# Patient Record
Sex: Male | Born: 1975
Health system: Southern US, Community
[De-identification: ages and names within clinical notes are randomized; demographics above are authoritative.]

## PROBLEM LIST (undated history)

## (undated) DIAGNOSIS — E119 Type 2 diabetes mellitus without complications: Secondary | ICD-10-CM

## (undated) DIAGNOSIS — J45909 Unspecified asthma, uncomplicated: Secondary | ICD-10-CM

## (undated) DIAGNOSIS — I10 Essential (primary) hypertension: Secondary | ICD-10-CM

## (undated) DIAGNOSIS — J302 Other seasonal allergic rhinitis: Secondary | ICD-10-CM

## (undated) DIAGNOSIS — E663 Overweight: Secondary | ICD-10-CM

## (undated) HISTORY — DX: Other seasonal allergic rhinitis: J30.2

## (undated) HISTORY — DX: Overweight: E66.3

---

## 1998-06-24 ENCOUNTER — Emergency Department (HOSPITAL_COMMUNITY): Admission: EM | Admit: 1998-06-24 | Discharge: 1998-06-24 | Payer: Self-pay | Admitting: Emergency Medicine

## 1998-06-25 ENCOUNTER — Encounter: Payer: Self-pay | Admitting: Emergency Medicine

## 1999-11-09 ENCOUNTER — Emergency Department (HOSPITAL_COMMUNITY): Admission: EM | Admit: 1999-11-09 | Discharge: 1999-11-09 | Payer: Self-pay | Admitting: Emergency Medicine

## 2000-05-20 ENCOUNTER — Emergency Department (HOSPITAL_COMMUNITY): Admission: EM | Admit: 2000-05-20 | Discharge: 2000-05-20 | Payer: Self-pay | Admitting: *Deleted

## 2000-10-01 ENCOUNTER — Emergency Department (HOSPITAL_COMMUNITY): Admission: EM | Admit: 2000-10-01 | Discharge: 2000-10-01 | Payer: Self-pay | Admitting: Emergency Medicine

## 2003-05-28 ENCOUNTER — Emergency Department (HOSPITAL_COMMUNITY): Admission: AD | Admit: 2003-05-28 | Discharge: 2003-05-28 | Payer: Self-pay | Admitting: Family Medicine

## 2007-09-19 ENCOUNTER — Emergency Department (HOSPITAL_COMMUNITY): Admission: EM | Admit: 2007-09-19 | Discharge: 2007-09-19 | Payer: Self-pay | Admitting: Family Medicine

## 2009-08-29 ENCOUNTER — Emergency Department (HOSPITAL_COMMUNITY): Admission: EM | Admit: 2009-08-29 | Discharge: 2009-08-29 | Payer: Self-pay | Admitting: Emergency Medicine

## 2009-12-09 ENCOUNTER — Emergency Department (HOSPITAL_BASED_OUTPATIENT_CLINIC_OR_DEPARTMENT_OTHER): Admission: EM | Admit: 2009-12-09 | Discharge: 2009-12-10 | Payer: Self-pay | Admitting: Emergency Medicine

## 2009-12-14 ENCOUNTER — Emergency Department (HOSPITAL_BASED_OUTPATIENT_CLINIC_OR_DEPARTMENT_OTHER): Admission: EM | Admit: 2009-12-14 | Discharge: 2009-12-14 | Payer: Self-pay | Admitting: Emergency Medicine

## 2010-06-30 ENCOUNTER — Emergency Department (HOSPITAL_COMMUNITY): Admission: EM | Admit: 2010-06-30 | Discharge: 2010-06-30 | Payer: Self-pay | Admitting: Family Medicine

## 2011-04-26 LAB — INFLUENZA A AND B ANTIGEN (CONVERTED LAB)
Inflenza A Ag: POSITIVE — AB
Influenza B Ag: NEGATIVE

## 2013-02-15 ENCOUNTER — Emergency Department (HOSPITAL_BASED_OUTPATIENT_CLINIC_OR_DEPARTMENT_OTHER)
Admission: EM | Admit: 2013-02-15 | Discharge: 2013-02-15 | Disposition: A | Discharge status: Home / Self Care | Payer: BC Managed Care – PPO | Attending: Emergency Medicine | Admitting: Emergency Medicine

## 2013-02-15 ENCOUNTER — Encounter (HOSPITAL_BASED_OUTPATIENT_CLINIC_OR_DEPARTMENT_OTHER): Payer: Self-pay | Admitting: Emergency Medicine

## 2013-02-15 DIAGNOSIS — R51 Headache: Secondary | ICD-10-CM | POA: Insufficient documentation

## 2013-02-15 DIAGNOSIS — J020 Streptococcal pharyngitis: Secondary | ICD-10-CM

## 2013-02-15 DIAGNOSIS — J45909 Unspecified asthma, uncomplicated: Secondary | ICD-10-CM | POA: Insufficient documentation

## 2013-02-15 HISTORY — DX: Unspecified asthma, uncomplicated: J45.909

## 2013-02-15 LAB — RAPID STREP SCREEN (MED CTR MEBANE ONLY): Streptococcus, Group A Screen (Direct): POSITIVE — AB

## 2013-02-15 MED ORDER — PENICILLIN G BENZATHINE 1200000 UNIT/2ML IM SUSP
2.4000 10*6.[IU] | Freq: Once | INTRAMUSCULAR | Status: AC
  Administered 2013-02-15: 2.4 10*6.[IU] via INTRAMUSCULAR
  Filled 2013-02-15: qty 4

## 2013-02-15 MED ORDER — IBUPROFEN 800 MG PO TABS
800.0000 mg | ORAL_TABLET | Freq: Once | ORAL | Status: AC
  Administered 2013-02-15: 800 mg via ORAL
  Filled 2013-02-15: qty 1

## 2013-02-15 NOTE — ED Provider Notes (Signed)
Medical screening examination/treatment/procedure(s) were performed by non-physician practitioner and as supervising physician I was immediately available for consultation/collaboration.   Mikell Camp, MD 02/15/13 2230 

## 2013-02-15 NOTE — ED Provider Notes (Signed)
   History    CSN: [id number] Arrival date & time 02/15/13  1904  First MD Initiated Contact with Patient 02/15/13 1913     Chief Complaint  Patient presents with  . Headache  . Sore Throat   (Consider location/radiation/quality/duration/timing/severity/associated sxs/prior Treatment) Patient is a 37 y.o. male presenting with headaches and pharyngitis. The history is provided by the patient. No language interpreter was used.  Headache Pain location:  Generalized Radiates to:  Does not radiate Onset quality:  Gradual Duration:  2 days Timing:  Constant Progression:  Worsening Chronicity:  New Context: not activity   Relieved by:  Nothing Worsened by:  Nothing tried Sore Throat Associated symptoms include headaches.  Pt complains of a sorethroat and swelling in glands Past Medical History  Diagnosis Date  . Asthma    History reviewed. No pertinent past surgical history. No family history on file. History  Substance Use Topics  . Smoking status: Never Smoker   . Smokeless tobacco: Not on file  . Alcohol Use: No    Review of Systems  Neurological: Positive for headaches.  All other systems reviewed and are negative.    Allergies  Review of patient's allergies indicates no known allergies.  Home Medications   Current Outpatient Rx  Name  Route  Sig  Dispense  Refill  . OVER THE COUNTER MEDICATION                BP 148/77  Pulse 99  Temp(Src) 99.2 F (37.3 C)  Resp 18  Ht 6\' 1"  (1.854 m)  Wt 320 lb (145.151 kg)  BMI 42.23 kg/m2  SpO2 98% Physical Exam  Nursing note and vitals reviewed. Constitutional: He appears well-developed and well-nourished.  HENT:  Head: Normocephalic.  Right Ear: External ear normal.  Mouth/Throat: Oropharyngeal exudate present.  Erythema throat,    Eyes: Conjunctivae are normal. Pupils are equal, round, and reactive to light.  Neck: Normal range of motion.  Cardiovascular: Normal rate and normal heart sounds.    Pulmonary/Chest: Effort normal.  Musculoskeletal: Normal range of motion.  Skin: Skin is warm.    ED Course  Procedures (including critical care time) Labs Reviewed  RAPID STREP SCREEN - Abnormal; Notable for the following:    Streptococcus, Group A Screen (Direct) POSITIVE (*)    All other components within normal limits   No results found. 1. Strep pharyngitis     MDM  Strep positive  Pt given injection of bicillian.   Ibuprofen or tylenol for fever  Elson Areas, PA-C 02/15/13 2033

## 2013-02-15 NOTE — ED Notes (Signed)
Pt c/o headache, sore throat, fever and body aches since this am.

## 2014-02-10 ENCOUNTER — Encounter (HOSPITAL_BASED_OUTPATIENT_CLINIC_OR_DEPARTMENT_OTHER): Payer: Self-pay | Admitting: Emergency Medicine

## 2014-02-10 ENCOUNTER — Emergency Department (HOSPITAL_BASED_OUTPATIENT_CLINIC_OR_DEPARTMENT_OTHER)
Admission: EM | Admit: 2014-02-10 | Discharge: 2014-02-10 | Disposition: A | Discharge status: Home / Self Care | Payer: Worker's Compensation | Attending: Emergency Medicine | Admitting: Emergency Medicine

## 2014-02-10 ENCOUNTER — Emergency Department (HOSPITAL_BASED_OUTPATIENT_CLINIC_OR_DEPARTMENT_OTHER): Payer: Worker's Compensation

## 2014-02-10 DIAGNOSIS — J45909 Unspecified asthma, uncomplicated: Secondary | ICD-10-CM | POA: Insufficient documentation

## 2014-02-10 DIAGNOSIS — S5000XA Contusion of unspecified elbow, initial encounter: Secondary | ICD-10-CM | POA: Diagnosis not present

## 2014-02-10 DIAGNOSIS — Y9389 Activity, other specified: Secondary | ICD-10-CM | POA: Insufficient documentation

## 2014-02-10 DIAGNOSIS — Y9289 Other specified places as the place of occurrence of the external cause: Secondary | ICD-10-CM | POA: Insufficient documentation

## 2014-02-10 DIAGNOSIS — IMO0002 Reserved for concepts with insufficient information to code with codable children: Secondary | ICD-10-CM | POA: Insufficient documentation

## 2014-02-10 DIAGNOSIS — Y990 Civilian activity done for income or pay: Secondary | ICD-10-CM | POA: Insufficient documentation

## 2014-02-10 DIAGNOSIS — S59909A Unspecified injury of unspecified elbow, initial encounter: Secondary | ICD-10-CM | POA: Diagnosis present

## 2014-02-10 DIAGNOSIS — S5001XA Contusion of right elbow, initial encounter: Secondary | ICD-10-CM

## 2014-02-10 MED ORDER — IBUPROFEN 800 MG PO TABS: 800.0000 mg | ORAL_TABLET | Freq: Three times a day (TID) | ORAL | Status: DC

## 2014-02-10 NOTE — ED Provider Notes (Signed)
CSN: [id number]     Arrival date & time 02/10/14  1746 History   First MD Initiated Contact with Patient 02/10/14 1838     Chief Complaint  Patient presents with  . Elbow Injury    right     (Consider location/radiation/quality/duration/timing/severity/associated sxs/prior Treatment) HPI Comments: The patient is a 38 year old male presenting after a right elbow injury today. He reports he struck his elbow on the bed of his truck while at work. He reports pain with movement. He reports he put icy hot on the elbow and had some tingling in the upper arm and shooting pains, resolved prior to the ED. He denies other injury no numbness or tingling at this time.  He reports increase in pain with range of motion of right elbow.  The history is provided by the patient. No language interpreter was used.    Past Medical History  Diagnosis Date  . Asthma    History reviewed. No pertinent past surgical history. No family history on file. History  Substance Use Topics  . Smoking status: Never Smoker   . Smokeless tobacco: Not on file  . Alcohol Use: No    Review of Systems  Musculoskeletal: Positive for arthralgias. Negative for joint swelling.      Allergies  Review of patient's allergies indicates no known allergies.  Home Medications   Prior to Admission medications   Medication Sig Start Date End Date Taking? Authorizing Provider  OVER THE COUNTER MEDICATION     Historical Provider, MD   BP 159/83  Pulse 82  Temp(Src) 98.4 F (36.9 C) (Oral)  Resp 20  Ht 6\' 2"  (1.88 m)  Wt 320 lb (145.151 kg)  BMI 41.07 kg/m2  SpO2 97% Physical Exam  Nursing note and vitals reviewed. Constitutional: He is oriented to person, place, and time. He appears well-developed and well-nourished. No distress.  HENT:  Head: Normocephalic and atraumatic.  Neck: Neck supple.  Pulmonary/Chest: Effort normal. No respiratory distress.  Musculoskeletal:       Right elbow: He exhibits normal range of  motion, no swelling and no effusion. Tenderness found. Olecranon process tenderness noted.  Mild tenderness palpation over all process, no crepitus or deformity. No ecchymosis or erythema. Patient has normal sensation in bilateral upper and, good and equal grip strength bilaterally. Good cap refill. Right Radial pulse 2+.  Neurological: He is oriented to person, place, and time.  Skin: Skin is warm and dry.  Psychiatric: He has a normal mood and affect. His behavior is normal.    ED Course  Procedures (including critical care time) Labs Review Labs Reviewed - No data to display  Imaging Review Dg Elbow Complete Right  02/10/2014   CLINICAL DATA:  Pain.  EXAM: RIGHT ELBOW - COMPLETE 3+ VIEW  COMPARISON:  None.  FINDINGS: There is no evidence of fracture, dislocation, or joint effusion. There is no evidence of arthropathy or other focal bone abnormality. Soft tissues are unremarkable.  IMPRESSION: Negative.   Electronically Signed   By: Maisie Fushomas  Register   On: 02/10/2014 18:17     EKG Interpretation None      MDM   Final diagnoses:  Elbow contusion, right, initial encounter   Patient presents after injury to right elbow at work no obvious deformity on exam neurovascularly intact x-ray negative for acute findings. Likely contusion advised ice, elevation and an ibuprofen for pain. Discussed imaging results, and treatment plan with the patient. Return precautions given. Reports understanding and no other concerns at this  time.  Patient is stable for discharge at this time.  Meds given in ED:  Medications - No data to display  New Prescriptions   IBUPROFEN (ADVIL,MOTRIN) 800 MG TABLET    Take 1 tablet (800 mg total) by mouth 3 (three) times daily.       Clabe Seal, PA-C 02/10/14 2222

## 2014-02-10 NOTE — Discharge Instructions (Signed)
Call an orthopedic specialist for further evaluation of your elbow pain if symptoms persist. Call for a follow up appointment with a Family or Primary Care Provider.  Return to the Emergency Department if Ssmptoms worsen.   Take medication as prescribed.  Elevate your arm above your head when you're not using it. Ice your elbow 3-4 times a day as needed.

## 2014-02-10 NOTE — ED Notes (Signed)
Pt has paperwork of incident from workplace-no order for post accident UDS-pt states he does not want the bill to come to him and is agreeable to UDS-J Hanes, EMT in to perform UDS

## 2014-02-10 NOTE — ED Notes (Signed)
Patient states he was loosening a strap on his truck, and his right elbow struck the bed of the truck.

## 2014-02-11 NOTE — ED Provider Notes (Signed)
Medical screening examination/treatment/procedure(s) were performed by non-physician practitioner and as supervising physician I was immediately available for consultation/collaboration.   EKG Interpretation None       Ledon Weihe M Akaysha Cobern, MD 02/11/14 1033 

## 2016-08-23 ENCOUNTER — Encounter (HOSPITAL_BASED_OUTPATIENT_CLINIC_OR_DEPARTMENT_OTHER): Payer: Self-pay

## 2016-08-23 ENCOUNTER — Emergency Department (HOSPITAL_BASED_OUTPATIENT_CLINIC_OR_DEPARTMENT_OTHER)
Admission: EM | Admit: 2016-08-23 | Discharge: 2016-08-23 | Disposition: A | Discharge status: Home / Self Care | Payer: BLUE CROSS/BLUE SHIELD | Attending: Emergency Medicine | Admitting: Emergency Medicine

## 2016-08-23 ENCOUNTER — Emergency Department (HOSPITAL_BASED_OUTPATIENT_CLINIC_OR_DEPARTMENT_OTHER): Payer: BLUE CROSS/BLUE SHIELD

## 2016-08-23 DIAGNOSIS — J4521 Mild intermittent asthma with (acute) exacerbation: Secondary | ICD-10-CM | POA: Insufficient documentation

## 2016-08-23 DIAGNOSIS — R0602 Shortness of breath: Secondary | ICD-10-CM | POA: Diagnosis present

## 2016-08-23 DIAGNOSIS — J069 Acute upper respiratory infection, unspecified: Secondary | ICD-10-CM | POA: Diagnosis not present

## 2016-08-23 DIAGNOSIS — B9789 Other viral agents as the cause of diseases classified elsewhere: Secondary | ICD-10-CM

## 2016-08-23 MED ORDER — IPRATROPIUM-ALBUTEROL 0.5-2.5 (3) MG/3ML IN SOLN
3.0000 mL | Freq: Once | RESPIRATORY_TRACT | Status: AC
  Administered 2016-08-23: 3 mL via RESPIRATORY_TRACT
  Filled 2016-08-23: qty 3

## 2016-08-23 MED ORDER — PREDNISONE 10 MG PO TABS: 50.0000 mg | ORAL_TABLET | Freq: Every day | ORAL | 0 refills | Status: AC

## 2016-08-23 MED ORDER — ALBUTEROL SULFATE (2.5 MG/3ML) 0.083% IN NEBU: 5.0000 mg | INHALATION_SOLUTION | Freq: Once | RESPIRATORY_TRACT | Status: DC

## 2016-08-23 MED ORDER — ALBUTEROL SULFATE HFA 108 (90 BASE) MCG/ACT IN AERS
8.0000 | INHALATION_SPRAY | Freq: Once | RESPIRATORY_TRACT | Status: AC
  Administered 2016-08-23: 8 via RESPIRATORY_TRACT
  Filled 2016-08-23: qty 6.7

## 2016-08-23 MED ORDER — PREDNISONE 50 MG PO TABS
50.0000 mg | ORAL_TABLET | Freq: Once | ORAL | Status: AC
  Administered 2016-08-23: 50 mg via ORAL
  Filled 2016-08-23: qty 1

## 2016-08-23 MED ORDER — ALBUTEROL SULFATE HFA 108 (90 BASE) MCG/ACT IN AERS: 2.0000 | INHALATION_SPRAY | RESPIRATORY_TRACT | 1 refills | Status: AC | PRN

## 2016-08-23 MED FILL — predniSONE 10 MG TABS: 10 | 4 days supply | Qty: 20 | Fill #0

## 2016-08-23 NOTE — ED Triage Notes (Signed)
C/o cough x 4 days-NAD-steady gait 

## 2016-08-23 NOTE — Discharge Instructions (Signed)
Take steroids as prescribed starting tomorrow. If you have significant shortness of breath associated with that you need a breathing treatment, you can take 8 puffs albuterol through inhaler as needed every 3-4 hours. Otherwise, for mild shortness of breath, you can take as directed 2 puffs every 4 hours.   Return for worsening symptoms, including difficulty breathing, passing out, severe chest pain or any other symptoms concerning to you.

## 2016-08-23 NOTE — ED Notes (Signed)
ED Provider at bedside. 

## 2016-08-23 NOTE — ED Provider Notes (Signed)
[facility]   CSN: [id number] Arrival date & time: 08/23/16  1219     History   Chief Complaint Chief Complaint  Patient presents with  . Cough    HPI Mark Mccormick is a 41 y.o. male.  HPI 41 year old male who presents for shortness of breath and cough. He has a history of mild intermittent asthma, only requiring steroids once or twice a year he states. Has had mild upper respiratory infection recently with nonproductive cough, congestion and runny nose for the past 4 days. No fevers or chills. No known sick contacts. No lower extremity edema or calf tenderness. No chest pain. Has been feeling more short of breath over the past 2 days, and does not have an inhaler currently to use at home. No syncope or near syncope. No history of PE/DVT or recent immobilization.   Past Medical History:  Diagnosis Date  . Asthma   . Overweight   . Seasonal allergies     There are no active problems to display for this patient.   History reviewed. No pertinent surgical history.     Home Medications    Prior to Admission medications   Medication Sig Start Date End Date Taking? Authorizing Provider  albuterol (PROVENTIL HFA;VENTOLIN HFA) 108 (90 Base) MCG/ACT inhaler Inhale 2 puffs into the lungs every 4 (four) hours as needed for wheezing or shortness of breath. 08/23/16   Lavera Guise, MD  predniSONE (DELTASONE) 10 MG tablet Take 5 tablets (50 mg total) by mouth daily. 08/24/16   Lavera Guise, MD    Family History Family History  Problem Relation Age of Onset  . Hypertension Mother   . Diabetes Mellitus I Mother   . Hypertension Father   . Diabetes Mellitus I Sister   . Diabetes Mellitus I Brother   . Hypertension Paternal Grandmother   . Heart attack Paternal Grandmother     Social History Social History  Substance Use Topics  . Smoking status: Never Smoker  . Smokeless tobacco: Never Used  . Alcohol use No     Allergies   Patient has no  known allergies.   Review of Systems Review of Systems 10/14 systems reviewed and are negative other than those stated in the HPI'  Physical Exam Updated Vital Signs BP 145/90 (BP Location: Right Arm)   Pulse 91   Temp 99.8 F (37.7 C) (Oral)   Resp 18   Ht 6\' 1"  (1.854 m)   Wt (!) 327 lb (148.3 kg)   SpO2 99%   BMI 43.14 kg/m   Physical Exam Physical Exam  Nursing note and vitals reviewed. Constitutional: Well developed, well nourished, non-toxic, and in no acute distress Head: Normocephalic and atraumatic.  Mouth/Throat: Oropharynx is clear and moist.  Neck: Normal range of motion. Neck supple.  Cardiovascular: Normal rate and regular rhythm.   Pulmonary/Chest: Effort normal. Scattered expiratory wheezing on exam Abdominal: Soft. There is no tenderness. There is no rebound and no guarding.  Musculoskeletal: Normal range of motion.  no edema. No calf tenderness  Neurological: Alert, no facial droop, fluent speech, moves all extremities symmetrically Skin: Skin is warm and dry.  Psychiatric: Cooperative   ED Treatments / Results  Labs (all labs ordered are listed, but only abnormal results are displayed) Labs Reviewed - No data to display  EKG  EKG Interpretation None       Radiology Dg Chest 2 View  Result Date: 08/23/2016 CLINICAL DATA:  Cough for 4  days EXAM: CHEST  2 VIEW COMPARISON:  None. FINDINGS: There is a small calcified granuloma in the right mid lung. Lungs elsewhere are clear. Heart size and pulmonary vascularity are normal. No adenopathy. No bone lesions. IMPRESSION: Small calcified granuloma right mid lung. No edema or consolidation. Electronically Signed   By: Bretta BangWilliam  Woodruff III M.D.   On: 08/23/2016 14:05    Procedures Procedures (including critical care time)  Medications Ordered in ED Medications  ipratropium-albuterol (DUONEB) 0.5-2.5 (3) MG/3ML nebulizer solution 3 mL (3 mLs Nebulization Given 08/23/16 1321)  predniSONE (DELTASONE)  tablet 50 mg (50 mg Oral Given 08/23/16 1547)  albuterol (PROVENTIL HFA;VENTOLIN HFA) 108 (90 Base) MCG/ACT inhaler 8 puff (8 puffs Inhalation Given 08/23/16 1547)     Initial Impression / Assessment and Plan / ED Course  I have reviewed the triage vital signs and the nursing notes.  Pertinent labs & imaging results that were available during my care of the patient were reviewed by me and considered in my medical decision making (see chart for details).     Presenting with acute asthma exacerbation the setting of viral URI. Nontoxic in no acute distress. Vital signs within normal limits. Breathing comfortably on room air and speaking in full sentences. Expiratory wheezing noted on exam. Received Atrovent/albuterol and then subsequently a puffs through albuterol inhaler. Has clearance of lung sounds, and he feels significantly improved. Started on prednisone. Chest x-ray visualized and shows no infiltrate, edema or other acute cardiopulmonary processes. No antibiotics indicated. Discussed continue supportive care instructions for home. Strict return and follow-up instructions are reviewed. He expressed understanding of all discharge instructions, and felt comfortable with the plan of care.  Final Clinical Impressions(s) / ED Diagnoses   Final diagnoses:  Mild intermittent asthma with acute exacerbation  Viral URI with cough    New Prescriptions New Prescriptions   ALBUTEROL (PROVENTIL HFA;VENTOLIN HFA) 108 (90 BASE) MCG/ACT INHALER    Inhale 2 puffs into the lungs every 4 (four) hours as needed for wheezing or shortness of breath.   PREDNISONE (DELTASONE) 10 MG TABLET    Take 5 tablets (50 mg total) by mouth daily.     Lavera Guiseana Duo Ron Junco, MD 08/23/16 [phone]

## 2017-07-17 ENCOUNTER — Encounter (HOSPITAL_BASED_OUTPATIENT_CLINIC_OR_DEPARTMENT_OTHER): Payer: Self-pay | Admitting: Emergency Medicine

## 2017-07-17 ENCOUNTER — Other Ambulatory Visit: Payer: Self-pay

## 2017-07-17 ENCOUNTER — Emergency Department (HOSPITAL_BASED_OUTPATIENT_CLINIC_OR_DEPARTMENT_OTHER): Payer: BLUE CROSS/BLUE SHIELD

## 2017-07-17 ENCOUNTER — Emergency Department (HOSPITAL_BASED_OUTPATIENT_CLINIC_OR_DEPARTMENT_OTHER)
Admission: EM | Admit: 2017-07-17 | Discharge: 2017-07-17 | Disposition: A | Discharge status: Home / Self Care | Payer: BLUE CROSS/BLUE SHIELD | Attending: Emergency Medicine | Admitting: Emergency Medicine

## 2017-07-17 DIAGNOSIS — R079 Chest pain, unspecified: Secondary | ICD-10-CM | POA: Insufficient documentation

## 2017-07-17 DIAGNOSIS — Z79899 Other long term (current) drug therapy: Secondary | ICD-10-CM | POA: Insufficient documentation

## 2017-07-17 DIAGNOSIS — J45909 Unspecified asthma, uncomplicated: Secondary | ICD-10-CM | POA: Diagnosis not present

## 2017-07-17 LAB — BASIC METABOLIC PANEL
Anion gap: 7 (ref 5–15)
BUN: 15 mg/dL (ref 6–20)
CHLORIDE: 101 mmol/L (ref 101–111)
CO2: 27 mmol/L (ref 22–32)
Calcium: 9.1 mg/dL (ref 8.9–10.3)
Creatinine, Ser: 1.1 mg/dL (ref 0.61–1.24)
GFR calc Af Amer: >60 mL/min (ref >60)
GFR calc non Af Amer: >60 mL/min (ref >60)
GLUCOSE: 232 mg/dL — AB (ref 65–99)
Potassium: 4.1 mmol/L (ref 3.5–5.1)
Sodium: 135 mmol/L (ref 135–145)

## 2017-07-17 LAB — CBC
HCT: 42.8 % (ref 39.0–52.0)
HEMOGLOBIN: 14.6 g/dL (ref 13.0–17.0)
MCH: 28.2 pg (ref 26.0–34.0)
MCHC: 34.1 g/dL (ref 30.0–36.0)
MCV: 82.8 fL (ref 78.0–100.0)
Platelets: 236 10*3/uL (ref 150–400)
RBC: 5.17 MIL/uL (ref 4.22–5.81)
RDW: 12.2 % (ref 11.5–15.5)
WBC: 5.8 10*3/uL (ref 4.0–10.5)

## 2017-07-17 LAB — TROPONIN I
Troponin I: <0.03 ng/mL (ref <0.03)
Troponin I: <0.03 ng/mL (ref <0.03)

## 2017-07-17 NOTE — ED Provider Notes (Signed)
[facility] EMERGENCY DEPARTMENT Provider Note   CSN: [id number] Arrival date & time: 07/17/17  1648     History   Chief Complaint Chief Complaint  Patient presents with  . Chest Pain    HPI Mark Mccormick is a 41 y.o. male.  HPI 41 year old African-American male with no pertinent past medical history presents to the emergency department today for evaluation of left-sided chest pain.  Patient states that yesterday evening approximately 9 PM he was laying down when he felt a tightness in the left side of his chest that radiated to his left arm.  States he has some tingling in his left arm.  The patient states this resolved after several seconds by itself.  States he has not had any further episodes today.  His wife wanted him to get evaluated last night however patient states he will come this afternoon after work to get evaluated.  Patient describes the pain as tightness.  Nothing makes better or worse.  Denies any associated diaphoresis, nausea or emesis.  Patient states that the chest pain was nonexertional and not pleuritic in nature.  Denies any history of cardiac disease.  Denies any significant family cardiac disease.  Patient denies any history of DVT/PE, prolonged immobilization, recent hospitalizations or surgeries, unilateral leg swelling or calf tenderness, hemoptysis, tobacco use.  Patient does report being diagnosed with diabetes in the past however he is not currently on any medications.  Patient denies any history of hypertension.  Pt denies any fever, chill, ha, vision changes, lightheadedness, dizziness, congestion, neck pain, cough, abd pain, n/v/d, urinary symptoms, change in bowel habits, melena, hematochezia, lower extremity paresthesias.  Past Medical History:  Diagnosis Date  . Asthma   . Overweight   . Seasonal allergies     There are no active problems to display for this patient.   History reviewed. No pertinent surgical history.     Home  Medications    Prior to Admission medications   Medication Sig Start Date End Date Taking? Authorizing Provider  albuterol (PROVENTIL HFA;VENTOLIN HFA) 108 (90 Base) MCG/ACT inhaler Inhale 2 puffs into the lungs every 4 (four) hours as needed for wheezing or shortness of breath. 08/23/16   Lavera GuiseLiu, Dana Duo, MD  predniSONE (DELTASONE) 10 MG tablet Take 5 tablets (50 mg total) by mouth daily. 08/24/16   Lavera GuiseLiu, Dana Duo, MD    Family History Family History  Problem Relation Age of Onset  . Hypertension Mother   . Diabetes Mellitus I Mother   . Hypertension Father   . Diabetes Mellitus I Sister   . Diabetes Mellitus I Brother   . Hypertension Paternal Grandmother   . Heart attack Paternal Grandmother     Social History Social History   Tobacco Use  . Smoking status: Never Smoker  . Smokeless tobacco: Never Used  Substance Use Topics  . Alcohol use: No  . Drug use: No     Allergies   Patient has no known allergies.   Review of Systems Review of Systems  Constitutional: Negative for chills, diaphoresis and fever.  HENT: Negative for congestion.   Eyes: Negative for visual disturbance.  Respiratory: Negative for cough and shortness of breath.   Cardiovascular: Positive for chest pain. Negative for palpitations and leg swelling.  Gastrointestinal: Negative for abdominal pain, diarrhea, nausea and vomiting.  Genitourinary: Negative for dysuria, flank pain, frequency, hematuria and urgency.  Musculoskeletal: Negative for arthralgias and myalgias.  Skin: Negative for rash.  Neurological: Negative for dizziness,  syncope, weakness, light-headedness, numbness and headaches.  Psychiatric/Behavioral: Negative for sleep disturbance. The patient is not nervous/anxious.      Physical Exam Updated Vital Signs BP (!) 135/98 (BP Location: Right Arm)   Pulse 84   Temp 98 F (36.7 C) (Oral)   Resp 18   Ht 6\' 2"  (1.88 m)   Wt (!) 145.2 kg (320 lb)   SpO2 100%   BMI 41.09 kg/m    Physical Exam  Constitutional: He is oriented to person, place, and time. He appears well-developed and well-nourished.  Non-toxic appearance. No distress.  HENT:  Head: Normocephalic and atraumatic.  Nose: Nose normal.  Mouth/Throat: Oropharynx is clear and moist.  Eyes: Conjunctivae are normal. Pupils are equal, round, and reactive to light. Right eye exhibits no discharge. Left eye exhibits no discharge.  Neck: Normal range of motion. Neck supple. No JVD present. No tracheal deviation present.  Cardiovascular: Normal rate, regular rhythm, normal heart sounds and intact distal pulses. Exam reveals no gallop and no friction rub.  No murmur heard. Pulmonary/Chest: Effort normal and breath sounds normal. No stridor. No respiratory distress. He has no wheezes. He has no rales. He exhibits no tenderness.  No hypoxia or tachypnea.  Abdominal: Soft. Bowel sounds are normal. He exhibits no distension. There is no tenderness. There is no rebound and no guarding.  Musculoskeletal: Normal range of motion.  No lower extremity edema or calf tenderness.  Lymphadenopathy:    He has no cervical adenopathy.  Neurological: He is alert and oriented to person, place, and time.  Skin: Skin is warm and dry. Capillary refill takes less than 2 seconds. He is not diaphoretic.  Psychiatric: His behavior is normal. Judgment and thought content normal.  Nursing note and vitals reviewed.    ED Treatments / Results  Labs (all labs ordered are listed, but only abnormal results are displayed) Labs Reviewed  BASIC METABOLIC PANEL - Abnormal; Notable for the following components:      Result Value   Glucose, Bld 232 (*)    All other components within normal limits  CBC  TROPONIN I  TROPONIN I    EKG  EKG Interpretation  Date/Time:  Thursday July 17 2017 16:57:18 EST Ventricular Rate:  74 PR Interval:  166 QRS Duration: 94 QT Interval:  376 QTC Calculation: 417 R Axis:   31 Text  Interpretation:  Normal sinus rhythm with sinus arrhythmia no acute ST/T changes No old tracing to compare Confirmed by Pricilla Loveless [phone]) on 07/17/2017 5:12:49 PM       Radiology Dg Chest 2 View  Result Date: 07/17/2017 CLINICAL DATA:  Chest pain EXAM: CHEST  2 VIEW COMPARISON:  August 23, 2016 FINDINGS: There is a small granuloma in the right mid lung. There is no edema or consolidation. The heart size and pulmonary vascularity are normal. No adenopathy. No pneumothorax. No bone lesions. IMPRESSION: Small granuloma right mid lung.  No edema or consolidation. Electronically Signed   By: Bretta Bang III M.D.   On: 07/17/2017 17:13    Procedures Procedures (including critical care time)  Medications Ordered in ED Medications - No data to display   Initial Impression / Assessment and Plan / ED Course  I have reviewed the triage vital signs and the nursing notes.  Pertinent labs & imaging results that were available during my care of the patient were reviewed by me and considered in my medical decision making (see chart for details).     Pt presents to  the Ed today with complaints of cp. Patient is to be discharged with recommendation to follow up with PCP in regards to today's hospital visit. Chest pain is not likely of cardiac or pulmonary etiology d/t presentation, perc negative, VSS, no tracheal deviation, no JVD or new murmur, RRR, breath sounds equal bilaterally, EKG shows no signs of ischemia, negative delta troponin.  Chest x-ray does show a granuloma in the right middle lung.  This was seen on prior chest x-ray.  Patient has not followed up but will do so with his primary care doctor.  Do not feel this is associated patient's complaint today.  Patient does not have a white blood cell count, cough, fever though be concerning for infectious etiology.  Clinical presentation does not seem consistent with ACS, PE, dissection, pneumonia.  The patient's glucose is also elevated  with history of same.  Patient has metformin at home that he was supposed to take in the past but has never done so.  Encourage patient to take metformin.  Patient has had DKA in the ED given normal anion gap.  Encouraged follow-up with PCP. Pt has been advised to return to the ED is CP becomes exertional, associated with diaphoresis or nausea, radiates to left jaw/arm, worsens or becomes concerning in any way. Pt appears reliable for follow up and is agreeable to discharge.      Final Clinical Impressions(s) / ED Diagnoses   Final diagnoses:  Nonspecific chest pain    ED Discharge Orders    None       Wallace KellerLeaphart, Tycen Dockter T, PA-C 07/18/17 0326    Pricilla LovelessGoldston, Scott, MD 07/19/17 [phone]

## 2017-07-17 NOTE — Discharge Instructions (Signed)
Your imaging and lab work has been very reassuring.  No signs of a heart attack or blood clot.  Unknown etiology of your symptoms.  Your chest x-ray does show some inflammation of your right lung that was seen on prior x-rays.  This needs to be follow-up with your primary care.  He also need to follow-up concerning your blood sugars with your primary care doctor.  If you have your metformin at home he may start taking this.  Return to the ED with any worsening symptoms.

## 2017-07-17 NOTE — ED Triage Notes (Signed)
Patient reports that intermittent chest pain x 1 week. Patient states that he feels a little flutter and numbness to his left arm. The patientis  walking and in no active distress at this time. Patient checked in and then went to the vending machines prior to coming to triage.

## 2018-12-26 ENCOUNTER — Emergency Department (HOSPITAL_BASED_OUTPATIENT_CLINIC_OR_DEPARTMENT_OTHER)
Admission: EM | Admit: 2018-12-26 | Discharge: 2018-12-26 | Disposition: A | Discharge status: Home / Self Care | Payer: BLUE CROSS/BLUE SHIELD | Attending: Emergency Medicine | Admitting: Emergency Medicine

## 2018-12-26 ENCOUNTER — Encounter (HOSPITAL_BASED_OUTPATIENT_CLINIC_OR_DEPARTMENT_OTHER): Payer: Self-pay | Admitting: Emergency Medicine

## 2018-12-26 ENCOUNTER — Emergency Department (HOSPITAL_BASED_OUTPATIENT_CLINIC_OR_DEPARTMENT_OTHER): Payer: BLUE CROSS/BLUE SHIELD

## 2018-12-26 ENCOUNTER — Other Ambulatory Visit: Payer: Self-pay

## 2018-12-26 DIAGNOSIS — M549 Dorsalgia, unspecified: Secondary | ICD-10-CM | POA: Diagnosis not present

## 2018-12-26 DIAGNOSIS — M79642 Pain in left hand: Secondary | ICD-10-CM | POA: Diagnosis not present

## 2018-12-26 DIAGNOSIS — Y999 Unspecified external cause status: Secondary | ICD-10-CM | POA: Insufficient documentation

## 2018-12-26 DIAGNOSIS — R51 Headache: Secondary | ICD-10-CM | POA: Insufficient documentation

## 2018-12-26 DIAGNOSIS — J45909 Unspecified asthma, uncomplicated: Secondary | ICD-10-CM | POA: Diagnosis not present

## 2018-12-26 DIAGNOSIS — Y9389 Activity, other specified: Secondary | ICD-10-CM | POA: Insufficient documentation

## 2018-12-26 DIAGNOSIS — Y92410 Unspecified street and highway as the place of occurrence of the external cause: Secondary | ICD-10-CM | POA: Diagnosis not present

## 2018-12-26 DIAGNOSIS — R0789 Other chest pain: Secondary | ICD-10-CM | POA: Insufficient documentation

## 2018-12-26 DIAGNOSIS — M79641 Pain in right hand: Secondary | ICD-10-CM | POA: Insufficient documentation

## 2018-12-26 DIAGNOSIS — Z79899 Other long term (current) drug therapy: Secondary | ICD-10-CM | POA: Insufficient documentation

## 2018-12-26 MED ORDER — ACETAMINOPHEN 500 MG PO TABS
1000.0000 mg | ORAL_TABLET | Freq: Once | ORAL | Status: AC
  Administered 2018-12-26: 18:00:00 1000 mg via ORAL
  Filled 2018-12-26: qty 2

## 2018-12-26 MED ORDER — IBUPROFEN 800 MG PO TABS
800.0000 mg | ORAL_TABLET | Freq: Once | ORAL | Status: AC
  Administered 2018-12-26: 18:00:00 800 mg via ORAL
  Filled 2018-12-26: qty 1

## 2018-12-26 NOTE — Discharge Instructions (Signed)
Take 4 over the counter ibuprofen tablets 3 times a day or 2 over-the-counter naproxen tablets twice a day for pain. Also take tylenol 1000mg (2 extra strength) four times a day.    You will hurt worse tomorrow that is normal.  Please return for shortness of breath or if you are suddenly confused, vomiting or having trouble breathing.
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
## 2019-02-24 ENCOUNTER — Emergency Department (HOSPITAL_BASED_OUTPATIENT_CLINIC_OR_DEPARTMENT_OTHER)
Admission: EM | Admit: 2019-02-24 | Discharge: 2019-02-24 | Disposition: A | Discharge status: Home / Self Care | Payer: BC Managed Care – PPO | Attending: Emergency Medicine | Admitting: Emergency Medicine

## 2019-02-24 ENCOUNTER — Encounter (HOSPITAL_BASED_OUTPATIENT_CLINIC_OR_DEPARTMENT_OTHER): Payer: Self-pay

## 2019-02-24 ENCOUNTER — Other Ambulatory Visit: Payer: Self-pay

## 2019-02-24 DIAGNOSIS — R630 Anorexia: Secondary | ICD-10-CM

## 2019-02-24 DIAGNOSIS — R5383 Other fatigue: Secondary | ICD-10-CM | POA: Diagnosis not present

## 2019-02-24 DIAGNOSIS — U071 COVID-19: Secondary | ICD-10-CM | POA: Diagnosis not present

## 2019-02-24 DIAGNOSIS — R509 Fever, unspecified: Secondary | ICD-10-CM

## 2019-02-24 DIAGNOSIS — J45909 Unspecified asthma, uncomplicated: Secondary | ICD-10-CM | POA: Diagnosis not present

## 2019-02-24 DIAGNOSIS — B349 Viral infection, unspecified: Secondary | ICD-10-CM

## 2019-02-24 MED ORDER — ACETAMINOPHEN 325 MG PO TABS
650.0000 mg | ORAL_TABLET | Freq: Once | ORAL | Status: AC
  Administered 2019-02-24: 650 mg via ORAL
  Filled 2019-02-24: qty 2

## 2019-02-24 MED ORDER — ONDANSETRON 4 MG PO TBDP: 4.0000 mg | ORAL_TABLET | Freq: Three times a day (TID) | ORAL | 0 refills | Status: AC | PRN

## 2019-02-24 NOTE — ED Triage Notes (Signed)
Pt c/o loss of appetite x 2 days-denies n/v/d-denies loss of taste/smell-pt with cough in triage that he states just started and fever which he was unaware of-NAD-steady gait

## 2019-02-24 NOTE — ED Provider Notes (Signed)
[facility] Emergency Department Provider Note MRN:  [id number]  [place] date & time: 02/24/19     Chief Complaint   Anorexia   History of Present Illness   Mark Mccormick is a 43 y.o. year-old male with no pertinent past medical history presenting to the ED with chief complaint of anorexia.  1 to 2 days of decreased appetite.  Tries to eat, but then quickly loses interest after 1 or 2 bites.  Feels hot but was unaware of having fever.  Denies headache or vision change, no nausea or vomiting, no chest pain or shortness of breath, no abdominal pain, no rash, no body aches.  Mild general malaise.  Feeling fatigued at work.  Review of Systems  A complete 10 system review of systems was obtained and all systems are negative except as noted in the HPI and PMH.   Patient's Health History    Past Medical History:  Diagnosis Date  . Asthma   . Overweight   . Seasonal allergies     History reviewed. No pertinent surgical history.  Family History  Problem Relation Age of Onset  . Hypertension Mother   . Diabetes Mellitus I Mother   . Hypertension Father   . Diabetes Mellitus I Sister   . Diabetes Mellitus I Brother   . Hypertension Paternal Grandmother   . Heart attack Paternal Grandmother     Social History   Socioeconomic History  . Marital status: Married    Spouse name: Not on file  . Number of children: Not on file  . Years of education: Not on file  . Highest education level: Not on file  Occupational History  . Not on file  Social Needs  . Financial resource strain: Not on file  . Food insecurity    Worry: Not on file    Inability: Not on file  . Transportation needs    Medical: Not on file    Non-medical: Not on file  Tobacco Use  . Smoking status: Never Smoker  . Smokeless tobacco: Never Used  Substance and Sexual Activity  . Alcohol use: No  . Drug use: No  . Sexual activity: Not on file  Lifestyle  . Physical activity   Days per week: Not on file    Minutes per session: Not on file  . Stress: Not on file  Relationships  . Social Herbalist on phone: Not on file    Gets together: Not on file    Attends religious service: Not on file    Active member of club or organization: Not on file    Attends meetings of clubs or organizations: Not on file    Relationship status: Not on file  . Intimate partner violence    Fear of current or ex partner: Not on file    Emotionally abused: Not on file    Physically abused: Not on file    Forced sexual activity: Not on file  Other Topics Concern  . Not on file  Social History Narrative  . Not on file     Physical Exam  Vital Signs and Nursing Notes reviewed Vitals:   02/24/19 1259  BP: (!) 134/98  Pulse: 77  Resp: 16  Temp: (!) 100.5 F (38.1 C)  SpO2: 98%    CONSTITUTIONAL: Well-appearing, NAD NEURO:  Alert and oriented x 3, no focal deficits EYES:  eyes equal and reactive ENT/NECK:  no LAD, no JVD CARDIO: Regular rate,  well-perfused, normal S1 and S2 PULM:  CTAB no wheezing or rhonchi GI/GU:  normal bowel sounds, non-distended, non-tender MSK/SPINE:  No gross deformities, no edema SKIN:  no rash, atraumatic PSYCH:  Appropriate speech and behavior  Diagnostic and Interventional Summary    Labs Reviewed  NOVEL CORONAVIRUS, NAA (HOSPITAL ORDER, SEND-OUT TO REF LAB)    No orders to display    Medications  acetaminophen (TYLENOL) tablet 650 mg (650 mg Oral Given 02/24/19 1303)     Procedures Critical Care  ED Course and Medical Decision Making  I have reviewed the triage vital signs and the nursing notes.  Pertinent labs & imaging results that were available during my care of the patient were reviewed by me and considered in my medical decision making (see below for details).  Suspect viral illness, possibly coronavirus in this 43 year old male, otherwise healthy other than obesity.  Is febrile on arrival but well-appearing,  otherwise normal vital signs, no increased work of breathing, clear lungs.  Normal neurological exam, no meningismus.  He is appropriate for reassurance and outpatient symptomatic management.  Swab for coronavirus here, educated on quarantine specifics.  After the discussed management above, the patient was determined to be safe for discharge.  The patient was in agreement with this plan and all questions regarding their care were answered.  ED return precautions were discussed and the patient will return to the ED with any significant worsening of condition.  Sandi MariscalDerrick O Medders was evaluated in Emergency Department on 02/24/2019 for the symptoms described in the history of present illness. He was evaluated in the context of the global COVID-19 pandemic, which necessitated consideration that the patient might be at risk for infection with the SARS-CoV-2 virus that causes COVID-19. Institutional protocols and algorithms that pertain to the evaluation of patients at risk for COVID-19 are in a state of rapid change based on information released by regulatory bodies including the CDC and federal and state organizations. These policies and algorithms were followed during the patient's care in the ED.   Elmer SowMichael M. Pilar PlateBero, MD [facility] [email or web address]  Final Clinical Impressions(s) / ED Diagnoses     ICD-10-CM   1. Viral illness  B34.9   2. Fever, unspecified fever cause  R50.9   3. Poor appetite  R63.0     ED Discharge Orders         Ordered    ondansetron (ZOFRAN ODT) 4 MG disintegrating tablet  Every 8 hours PRN     02/24/19 1347             Sabas SousBero, [place] M, MD 02/24/19 1350

## 2019-02-24 NOTE — Discharge Instructions (Addendum)
You were evaluated in the Emergency Department and after careful evaluation, we did not find any emergent condition requiring admission or further testing in the hospital.  Your symptoms today seem to be due to a viral illness.  Be sure to drink plenty of water at home and use the nausea medication provided to try to help you eat.  The novel coronavirus is possible.  We tested you here in the emergency department and the results should return in about 2 days.  Until that time we recommend home quarantine as we discussed.  If positive we will recommend continue to quarantine per the CDC recommendations.  Please return to the Emergency Department if you experience any worsening of your condition.  We encourage you to follow up with a primary care provider.  Thank you for allowing [place] to be a part of your care.

## 2019-03-01 LAB — NOVEL CORONAVIRUS, NAA (HOSP ORDER, SEND-OUT TO REF LAB; TAT 18-24 HRS): SARS-CoV-2, NAA: DETECTED — AB
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
## 2023-03-01 ENCOUNTER — Encounter (HOSPITAL_BASED_OUTPATIENT_CLINIC_OR_DEPARTMENT_OTHER): Payer: Self-pay

## 2023-03-01 ENCOUNTER — Other Ambulatory Visit: Payer: Self-pay

## 2023-03-01 ENCOUNTER — Emergency Department (HOSPITAL_BASED_OUTPATIENT_CLINIC_OR_DEPARTMENT_OTHER)
Admission: EM | Admit: 2023-03-01 | Discharge: 2023-03-01 | Disposition: A | Discharge status: Home / Self Care | Payer: BC Managed Care – PPO | Source: Home / Self Care | Attending: Emergency Medicine | Admitting: Emergency Medicine

## 2023-03-01 DIAGNOSIS — Z7984 Long term (current) use of oral hypoglycemic drugs: Secondary | ICD-10-CM | POA: Insufficient documentation

## 2023-03-01 DIAGNOSIS — L02412 Cutaneous abscess of left axilla: Secondary | ICD-10-CM | POA: Insufficient documentation

## 2023-03-01 DIAGNOSIS — E1165 Type 2 diabetes mellitus with hyperglycemia: Secondary | ICD-10-CM | POA: Insufficient documentation

## 2023-03-01 DIAGNOSIS — L0211 Cutaneous abscess of neck: Secondary | ICD-10-CM | POA: Insufficient documentation

## 2023-03-01 DIAGNOSIS — R739 Hyperglycemia, unspecified: Secondary | ICD-10-CM

## 2023-03-01 DIAGNOSIS — L0291 Cutaneous abscess, unspecified: Secondary | ICD-10-CM

## 2023-03-01 DIAGNOSIS — L0231 Cutaneous abscess of buttock: Secondary | ICD-10-CM | POA: Insufficient documentation

## 2023-03-01 HISTORY — DX: Type 2 diabetes mellitus without complications: E11.9

## 2023-03-01 HISTORY — DX: Essential (primary) hypertension: I10

## 2023-03-01 LAB — CBG MONITORING, ED: Glucose-Capillary: 280 mg/dL — ABNORMAL HIGH (ref 70–99)

## 2023-03-01 MED ORDER — LANCET DEVICE MISC: 1.0000 | Freq: Three times a day (TID) | 0 refills | Status: AC

## 2023-03-01 MED ORDER — BLOOD GLUCOSE MONITORING SUPPL DEVI: 1.0000 | Freq: Three times a day (TID) | 0 refills | Status: AC

## 2023-03-01 MED ORDER — METFORMIN HCL 500 MG PO TABS: 500.0000 mg | ORAL_TABLET | Freq: Two times a day (BID) | ORAL | 1 refills | Status: AC

## 2023-03-01 MED ORDER — DOXYCYCLINE HYCLATE 100 MG PO CAPS: 100.0000 mg | ORAL_CAPSULE | Freq: Two times a day (BID) | ORAL | 0 refills | Status: DC

## 2023-03-01 MED ORDER — LANCETS MISC. MISC: 1.0000 | Freq: Three times a day (TID) | 0 refills | Status: AC

## 2023-03-01 MED ORDER — BLOOD GLUCOSE TEST VI STRP
1.0000 | ORAL_STRIP | Freq: Three times a day (TID) | 0 refills | Status: AC
Start: 2023-03-01 — End: ?

## 2023-03-01 MED ORDER — METFORMIN HCL 500 MG PO TABS: 500.0000 mg | ORAL_TABLET | Freq: Two times a day (BID) | ORAL | 1 refills | Status: DC

## 2023-03-01 NOTE — ED Provider Notes (Signed)
[facility] [facility] Provider Note   CSN: [id number] Arrival date & time: 03/01/23  1340     History  Chief Complaint  Patient presents with   Abscess    Mark Mccormick is a 47 y.o. male with a past medical history of diabetes who presents emergency department with concerns for multiple abscess to his buttocks, neck, left axilla.  Noted these abscesses started a week ago.  Notes that he has been compliant with his diabetic medications and his glucose is typically 187.  Unsure of his last A1c.  Has tried at home remedies to help with the abscess.  Denies fever, drainage, chest pain, shortness of breath, abdominal pain, nausea, vomiting.  Patient declines ever being on glipizide for his diabetes.  Denies history of at bedtime.   Per patient chart review: Patient's last appoint with his primary care provider was in 2021 with his A1c being 9.8 at that time.  Patient was on metformin and glipizide.   The history is provided by the patient. No language interpreter was used.       Home Medications Prior to Admission medications   Medication Sig Start Date End Date Taking? Authorizing Provider  Blood Glucose Monitoring Suppl DEVI 1 each by Does not apply route in the morning, at noon, and at bedtime. May substitute to any manufacturer covered by patient's insurance. 03/01/23  Yes Ashanti Ratti A, PA-C  doxycycline (VIBRAMYCIN) 100 MG capsule Take 1 capsule (100 mg total) by mouth 2 (two) times daily. 03/01/23  Yes Jurell Basista A, PA-C  Glucose Blood (BLOOD GLUCOSE TEST STRIPS) STRP 1 each by In Vitro route in the morning, at noon, and at bedtime. May substitute to any manufacturer covered by patient's insurance. 03/01/23  Yes Rosaleigh Brazzel A, PA-C  Lancet Device MISC 1 each by Does not apply route in the morning, at noon, and at bedtime. May substitute to any manufacturer covered by patient's insurance. 03/01/23 03/31/23 Yes Akeen Ledyard A, PA-C  Lancets  Misc. MISC 1 each by Does not apply route in the morning, at noon, and at bedtime. May substitute to any manufacturer covered by patient's insurance. 03/01/23 03/31/23 Yes Judge Duque A, PA-C  albuterol (PROVENTIL HFA;VENTOLIN HFA) 108 (90 Base) MCG/ACT inhaler Inhale 2 puffs into the lungs every 4 (four) hours as needed for wheezing or shortness of breath. 08/23/16   Lavera Guise, MD  metFORMIN (GLUCOPHAGE) 500 MG tablet Take 1 tablet (500 mg total) by mouth 2 (two) times daily with a meal. 03/01/23   Jamillia Closson A, PA-C  ondansetron (ZOFRAN ODT) 4 MG disintegrating tablet Take 1 tablet (4 mg total) by mouth every 8 (eight) hours as needed for nausea or vomiting. 02/24/19   Sabas Sous, MD  predniSONE (DELTASONE) 10 MG tablet Take 5 tablets (50 mg total) by mouth daily. 08/24/16   Lavera Guise, MD      Allergies    Patient has no known allergies.    Review of Systems   Review of Systems  All other systems reviewed and are negative.   Physical Exam Updated Vital Signs BP (!) 160/81 (BP Location: Left Arm)   Pulse 89   Temp 98.3 F (36.8 C) (Oral)   Resp 16   Ht 6\' 2"  (1.88 m)   Wt (!) 140.6 kg   SpO2 97%   BMI 39.80 kg/m  Physical Exam Vitals and nursing note reviewed.  Constitutional:      General: He  is not in acute distress.    Appearance: Normal appearance.  Eyes:     General: No scleral icterus.    Extraocular Movements: Extraocular movements intact.  Cardiovascular:     Rate and Rhythm: Normal rate.  Pulmonary:     Effort: Pulmonary effort is normal. No respiratory distress.  Abdominal:     Palpations: Abdomen is soft. There is no mass.     Tenderness: There is no abdominal tenderness.  Musculoskeletal:        General: Normal range of motion.     Cervical back: Neck supple.  Skin:    General: Skin is warm and dry.     Findings: No rash.     Comments: RN chaperone Ihor Dow) present for exam. Two areas of induration noted to right posterior neck. Multiple  areas of induration noted to right glute without concerns for fluctuance or erythema noted. No appreciable drainage noted. Area of induration noted to left upper axilla. Area of drainage noted to left axilla without appreciable TTP or surrounding erythema noted.   Neurological:     Mental Status: He is alert.     Sensory: Sensation is intact.     Motor: Motor function is intact.  Psychiatric:        Behavior: Behavior normal.     ED Results / Procedures / Treatments   Labs (all labs ordered are listed, but only abnormal results are displayed) Labs Reviewed  CBG MONITORING, ED - Abnormal; Notable for the following components:      Result Value   Glucose-Capillary 280 (*)    All other components within normal limits    EKG None  Radiology No results found.  Procedures Procedures    Medications Ordered in ED Medications - No data to display  ED Course/ Medical Decision Making/ A&P Clinical Course as of 03/01/23 1532  Sat Mar 01, 2023  1523 Discussed with patient in detail regarding elevated CBG today as well as utilization of i-STAT Chem-8 here today to rule out concerns for DKA.  Discussed in detail and at length with patient and his wife regarding additional testing to rule out DKA.  Offered IVF to help with decreasing glucose, patient declines at this time. At this time patient declines additional testing to rule out DKA.  Patient denies having chest pain, shortness of breath, abdominal pain, nausea, vomiting.  Patient notes that he came to the emergency department due to his concerns for the multiple abscesses that he has.  Notes that he will make an appointment with his primary care provider in the following week.  Also discussed with patient that we will start him back on his metformin.  Patient agreeable at this time.  Patient appears safe for discharge at this time. [SB]    Clinical Course User Index [SB] Kathleen Likins A, PA-C                             Medical  Decision Making Risk OTC drugs. Prescription drug management.   Pt presents with concerns for abscess onset 2-3 days. History of DM. Not taking his diabetic meds. Vital signs, pt afebrile. On exam, pt with RN chaperone present for exam. Two areas of induration noted to right posterior neck. Multiple areas of induration noted to right glute without concerns for fluctuance or erythema noted. No appreciable drainage noted. Area of induration noted to left upper axilla. Area of drainage noted to left axilla without  appreciable TTP or surrounding erythema noted. No acute cardiovascular, respiratory, abdominal exam findings. Differential diagnosis includes cellulitis, abscess.    Co morbidities that complicate the patient evaluation: DM  Additional history obtained:  External records from outside source obtained and reviewed including: Patient's last appoint with his primary care provider was in 2021 with his A1c being 9.8 at that time.  Patient was on metformin and glipizide.  Labs:  I ordered, and personally interpreted labs.  The pertinent results include:   CBG elevated at 280  Disposition: Presentation suspicious for multiple areas of induration, concerns for fluctuance needing drainage at this time. After consideration of the diagnostic results and the patients response to treatment, I feel that the patient would benefit from Discharge home. Glucometer kit prescribed for patient today. Doxycycline Rx. Supportive care measures and strict return precautions discussed with patient at bedside. Pt acknowledges and verbalizes understanding. Pt appears safe for discharge. Follow up as indicated in discharge paperwork.    This chart was dictated using voice recognition software, Dragon. Despite the best efforts of this provider to proofread and correct errors, errors may still occur which can change documentation meaning.  Final Clinical Impression(s) / ED Diagnoses Final diagnoses:  Abscess   Hyperglycemia    Rx / DC Orders ED Discharge Orders          Ordered    doxycycline (VIBRAMYCIN) 100 MG capsule  2 times daily        03/01/23 1528    metFORMIN (GLUCOPHAGE) 500 MG tablet  2 times daily with meals,   Status:  Discontinued        03/01/23 1528    Blood Glucose Monitoring Suppl DEVI  3 times daily        03/01/23 1528    Glucose Blood (BLOOD GLUCOSE TEST STRIPS) STRP  3 times daily        03/01/23 1528    Lancet Device MISC  3 times daily        03/01/23 1528    Lancets Misc. MISC  3 times daily        03/01/23 1528    metFORMIN (GLUCOPHAGE) 500 MG tablet  2 times daily with meals        03/01/23 1528              Edwardo Wojnarowski A, PA-C 03/01/23 1537    Villa Hugo I, DO 03/02/23 [phone]

## 2023-03-01 NOTE — ED Triage Notes (Signed)
Patient stated he has an abscess on his buttocks and neck. He started last week.

## 2023-03-01 NOTE — Discharge Instructions (Addendum)
It was a pleasure take care of you today!  Your CBG was elevated today at 280.  You will be sent a prescription for the blood glucose meter kit and supplies.  It is important that you check your sugars in the morning for a fasting sugar.  Also check your postprandial level (2 hours after your largest meal).  Is important that you write these numbers down and keep a log to show to your primary care provider.  Will also be sent a refill of your metformin, take as directed.  You will be sent a prescription for doxycycline, take as directed.  You may apply warm compress to your abscesses up to 15 minutes at a time, and should consider to treat your skin and the warm compress.  Return to the emergency department if you experience increasing/worsening symptoms.

## 2023-06-16 ENCOUNTER — Emergency Department (HOSPITAL_BASED_OUTPATIENT_CLINIC_OR_DEPARTMENT_OTHER)
Admission: EM | Admit: 2023-06-16 | Discharge: 2023-06-16 | Disposition: A | Discharge status: Home / Self Care | Payer: Self-pay | Attending: Emergency Medicine | Admitting: Emergency Medicine

## 2023-06-16 ENCOUNTER — Encounter (HOSPITAL_BASED_OUTPATIENT_CLINIC_OR_DEPARTMENT_OTHER): Payer: Self-pay | Admitting: Emergency Medicine

## 2023-06-16 ENCOUNTER — Other Ambulatory Visit: Payer: Self-pay

## 2023-06-16 DIAGNOSIS — E119 Type 2 diabetes mellitus without complications: Secondary | ICD-10-CM | POA: Insufficient documentation

## 2023-06-16 DIAGNOSIS — Z7951 Long term (current) use of inhaled steroids: Secondary | ICD-10-CM | POA: Insufficient documentation

## 2023-06-16 DIAGNOSIS — L0211 Cutaneous abscess of neck: Secondary | ICD-10-CM | POA: Insufficient documentation

## 2023-06-16 DIAGNOSIS — L03221 Cellulitis of neck: Secondary | ICD-10-CM | POA: Insufficient documentation

## 2023-06-16 DIAGNOSIS — J45909 Unspecified asthma, uncomplicated: Secondary | ICD-10-CM | POA: Insufficient documentation

## 2023-06-16 DIAGNOSIS — Z7984 Long term (current) use of oral hypoglycemic drugs: Secondary | ICD-10-CM | POA: Insufficient documentation

## 2023-06-16 LAB — COMPREHENSIVE METABOLIC PANEL
ALT: 31 U/L (ref 0–44)
AST: 22 U/L (ref 15–41)
Albumin: 3.9 g/dL (ref 3.5–5.0)
Alkaline Phosphatase: 90 U/L (ref 38–126)
Anion gap: 9 (ref 5–15)
BUN: 14 mg/dL (ref 6–20)
CO2: 26 mmol/L (ref 22–32)
Calcium: 9 mg/dL (ref 8.9–10.3)
Chloride: 102 mmol/L (ref 98–111)
Creatinine, Ser: 1.19 mg/dL (ref 0.61–1.24)
GFR, Estimated: >60 mL/min (ref >60)
Glucose, Bld: 285 mg/dL — ABNORMAL HIGH (ref 70–99)
Potassium: 4 mmol/L (ref 3.5–5.1)
Sodium: 137 mmol/L (ref 135–145)
Total Bilirubin: 0.6 mg/dL (ref <1.2)
Total Protein: 7.7 g/dL (ref 6.5–8.1)

## 2023-06-16 LAB — URINALYSIS, W/ REFLEX TO CULTURE (INFECTION SUSPECTED)
Bilirubin Urine: NEGATIVE
Glucose, UA: >=500 mg/dL — AB
Hgb urine dipstick: NEGATIVE
Ketones, ur: NEGATIVE mg/dL
Leukocytes,Ua: NEGATIVE
Nitrite: NEGATIVE
Protein, ur: NEGATIVE mg/dL
RBC / HPF: NONE SEEN RBC/hpf (ref 0–5)
Specific Gravity, Urine: 1.025 (ref 1.005–1.030)
WBC, UA: NONE SEEN WBC/hpf (ref 0–5)
pH: 5.5 (ref 5.0–8.0)

## 2023-06-16 LAB — CBC WITH DIFFERENTIAL/PLATELET
Abs Immature Granulocytes: 0.01 10*3/uL (ref 0.00–0.07)
Basophils Absolute: 0 10*3/uL (ref 0.0–0.1)
Basophils Relative: 1 %
Eosinophils Absolute: 0.1 10*3/uL (ref 0.0–0.5)
Eosinophils Relative: 3 %
HCT: 41.1 % (ref 39.0–52.0)
Hemoglobin: 13.6 g/dL (ref 13.0–17.0)
Immature Granulocytes: 0 %
Lymphocytes Relative: 41 %
Lymphs Abs: 2.3 10*3/uL (ref 0.7–4.0)
MCH: 28.3 pg (ref 26.0–34.0)
MCHC: 33.1 g/dL (ref 30.0–36.0)
MCV: 85.6 fL (ref 80.0–100.0)
Monocytes Absolute: 0.4 10*3/uL (ref 0.1–1.0)
Monocytes Relative: 7 %
Neutro Abs: 2.7 10*3/uL (ref 1.7–7.7)
Neutrophils Relative %: 48 %
Platelets: 219 10*3/uL (ref 150–400)
RBC: 4.8 MIL/uL (ref 4.22–5.81)
RDW: 12.9 % (ref 11.5–15.5)
WBC: 5.7 10*3/uL (ref 4.0–10.5)
nRBC: 0 % (ref 0.0–0.2)

## 2023-06-16 LAB — CBG MONITORING, ED: Glucose-Capillary: 269 mg/dL — ABNORMAL HIGH (ref 70–99)

## 2023-06-16 MED ORDER — LIDOCAINE-EPINEPHRINE (PF) 2 %-1:200000 IJ SOLN
10.0000 mL | Freq: Once | INTRAMUSCULAR | Status: AC
  Administered 2023-06-16: 10 mL
  Filled 2023-06-16: qty 20

## 2023-06-16 MED ORDER — DOXYCYCLINE HYCLATE 100 MG PO CAPS: 100.0000 mg | ORAL_CAPSULE | Freq: Two times a day (BID) | ORAL | 0 refills | Status: AC

## 2023-06-16 NOTE — ED Notes (Signed)
Dry sterile drsg to spot on his left side of neck

## 2023-06-16 NOTE — ED Triage Notes (Signed)
Recurrent skin infection , obvious small abscess left side neck , Hx diabetes , reports he gets this a lot , axillary area.  No fever

## 2023-06-16 NOTE — Discharge Instructions (Addendum)
We think the entire course of antibiotics that I prescribed.  You may want to cleanse with antibacterial soap, there is a soap over-the-counter at the pharmacy called benzyl peroxide which works well, but antibacterial soap should be sufficient.  You may want to refrain from close shaving in areas where this has happened in the past as sometimes skin irritation from shaving can contribute, she did change her razor blades frequently to prevent skin aches and infection from razor blade.  You continue to have multiple recurrent infection/abscess despite all of the above I recommend that you follow-up with a primary care doctor or dermatologist to discuss additional treatment, if you have recurrent abscess or cellulitis please return to the ED for further evaluation and management.

## 2023-06-16 NOTE — ED Provider Notes (Signed)
[place] [facility] Provider Note   CSN: [id number] Arrival date & time: 06/16/23  1645     History  Chief Complaint  Patient presents with   Recurrent Skin Infections    neck    Mark Mccormick is a 47 y.o. male past medical for asthma, diabetes, obesity who presents concern for recurrent skin infection, patient reports ingrown hair on the left side of neck which red, irritated, intermittently draining.  History of similar.  He reports mild improvement with Neosporin at home.  Also reports a small lesion in the right arm which is already drained.  Denies fever, chills, nausea, vomiting.  He reports he has been taking his metformin as prescribed.  HPI     Home Medications Prior to Admission medications   Medication Sig Start Date End Date Taking? Authorizing Provider  doxycycline (VIBRAMYCIN) 100 MG capsule Take 1 capsule (100 mg total) by mouth 2 (two) times daily. 06/16/23  Yes Tsuruko Murtha H, PA-C  albuterol (PROVENTIL HFA;VENTOLIN HFA) 108 (90 Base) MCG/ACT inhaler Inhale 2 puffs into the lungs every 4 (four) hours as needed for wheezing or shortness of breath. 08/23/16   Lavera Guise, MD  Blood Glucose Monitoring Suppl DEVI 1 each by Does not apply route in the morning, at noon, and at bedtime. May substitute to any manufacturer covered by patient's insurance. 03/01/23   Blue, Soijett A, PA-C  Glucose Blood (BLOOD GLUCOSE TEST STRIPS) STRP 1 each by In Vitro route in the morning, at noon, and at bedtime. May substitute to any manufacturer covered by patient's insurance. 03/01/23   Blue, Soijett A, PA-C  metFORMIN (GLUCOPHAGE) 500 MG tablet Take 1 tablet (500 mg total) by mouth 2 (two) times daily with a meal. 03/01/23   Blue, Soijett A, PA-C  ondansetron (ZOFRAN ODT) 4 MG disintegrating tablet Take 1 tablet (4 mg total) by mouth every 8 (eight) hours as needed for nausea or vomiting. 02/24/19   Sabas Sous, MD  predniSONE (DELTASONE)  10 MG tablet Take 5 tablets (50 mg total) by mouth daily. 08/24/16   Lavera Guise, MD      Allergies    Patient has no known allergies.    Review of Systems   Review of Systems  All other systems reviewed and are negative.   Physical Exam Updated Vital Signs BP (!) 150/93 (BP Location: Right Arm)   Pulse 80   Temp 97.8 F (36.6 C)   Resp 18   Wt 131.5 kg   SpO2 99%   BMI 37.23 kg/m  Physical Exam Vitals and nursing note reviewed.  Constitutional:      General: He is not in acute distress.    Appearance: Normal appearance.  HENT:     Head: Normocephalic and atraumatic.  Eyes:     General:        Right eye: No discharge.        Left eye: No discharge.  Cardiovascular:     Rate and Rhythm: Normal rate and regular rhythm.  Pulmonary:     Effort: Pulmonary effort is normal. No respiratory distress.  Musculoskeletal:        General: No deformity.  Skin:    General: Skin is warm and dry.     Comments: Tender indurated lump on left side of neck around 2 cm in diameter.  Small amount of fluctuance, and pus draining from site.  Neurological:     Mental Status: He is alert  and oriented to person, place, and time.  Psychiatric:        Mood and Affect: Mood normal.        Behavior: Behavior normal.     ED Results / Procedures / Treatments   Labs (all labs ordered are listed, but only abnormal results are displayed) Labs Reviewed  COMPREHENSIVE METABOLIC PANEL - Abnormal; Notable for the following components:      Result Value   Glucose, Bld 285 (*)    All other components within normal limits  URINALYSIS, W/ REFLEX TO CULTURE (INFECTION SUSPECTED) - Abnormal; Notable for the following components:   Glucose, UA >=500 (*)    Bacteria, UA RARE (*)    All other components within normal limits  CBG MONITORING, ED - Abnormal; Notable for the following components:   Glucose-Capillary 269 (*)    All other components within normal limits  CBC WITH DIFFERENTIAL/PLATELET     EKG None  Radiology No results found.  Procedures .Marland KitchenIncision and Drainage  Date/Time: 06/16/2023 6:40 PM  Performed by: Olene Floss, PA-C Authorized by: Olene Floss, PA-C   Consent:    Consent obtained:  Verbal   Consent given by:  Patient   Risks, benefits, and alternatives were discussed: yes     Risks discussed:  Bleeding, incomplete drainage, pain, infection and damage to other organs   Alternatives discussed:  No treatment Universal protocol:    Procedure explained and questions answered to patient or proxy's satisfaction: yes     Patient identity confirmed:  Verbally with patient Location:    Type:  Abscess   Size:  2   Location:  Neck   Neck location:  L anterior Pre-procedure details:    Skin preparation:  Povidone-iodine Sedation:    Sedation type:  None Anesthesia:    Anesthesia method:  None Procedure type:    Complexity:  Simple Procedure details:    Incision types:  Single straight   Incision depth:  Dermal   Wound management:  Probed and deloculated   Drainage:  Bloody and purulent   Drainage amount:  Scant   Packing materials:  None Post-procedure details:    Procedure completion:  Tolerated     Medications Ordered in ED Medications  lidocaine-EPINEPHrine (XYLOCAINE W/EPI) 2 %-1:200000 (PF) injection 10 mL (has no administration in time range)    ED Course/ Medical Decision Making/ A&P                                 Medical Decision Making Amount and/or Complexity of Data Reviewed Labs: ordered.   This patient is a 47 y.o. male  who presents to the ED for concern of skin infection, concern for cellulitis versus abscess.   Differential diagnoses prior to evaluation: The emergent differential diagnosis includes, but is not limited to, cellulitis, abscess, sepsis, bacteremia, versus other skin manifestation, considered skin cancer, hidradenitis, versus other. This is not an exhaustive differential.   Past Medical  History / Co-morbidities / Social History: Asthma, diabetes, obesity  Additional history: Chart reviewed. Pertinent results include: reviewed previous ED visits for similar  Physical Exam: Physical exam performed. The pertinent findings include: Tender indurated lump on left side of neck around 2 cm in diameter.  Small amount of fluctuance, and pus draining from site.  Hypertension the emergency department, blood pressure 150/93, vital signs otherwise stable  Lab Tests/Imaging studies: I personally interpreted labs/imaging and the pertinent results  include: CMP notable for elevated glucose 285, otherwise unremarkable, patient just eating candy prior to arrival.  UA unremarkable other than rare bacteria and glucose, no white blood cells, consistent with diabetes.  CBC unremarkable..   Medications: I ordered medication including lidocaine for I and D at bedside. Discharged with doxycyline, encourage benzoyl peroxide wash or other antibacterial for daily use, avoiding close shaving at problem areas.  I have reviewed the patients home medicines and have made adjustments as needed.   Disposition: After consideration of the diagnostic results and the patients response to treatment, I feel that patient is stable for discharge with plan as above, amount of drainage from abscess as documented above.   emergency department workup does not suggest an emergent condition requiring admission or immediate intervention beyond what has been performed at this time. The plan is: as above. The patient is safe for discharge and has been instructed to return immediately for worsening symptoms, change in symptoms or any other concerns.  Final Clinical Impression(s) / ED Diagnoses Final diagnoses:  Abscess of neck  Cellulitis of neck    Rx / DC Orders ED Discharge Orders          Ordered    doxycycline (VIBRAMYCIN) 100 MG capsule  2 times daily        06/16/23 1837              Brycelynn Stampley, Harrel Carina, PA-C 06/16/23 1841    Charlynne Pander, MD 06/16/23 [phone]
